# Patient Record
Sex: Male | Born: 1975 | Race: White | Hispanic: No | Marital: Married | Smoking: Never smoker
Health system: Southern US, Community
[De-identification: ages and names within clinical notes are randomized; demographics above are authoritative.]

## PROBLEM LIST (undated history)

## (undated) DIAGNOSIS — S34102A Unspecified injury to L2 level of lumbar spinal cord, initial encounter: Secondary | ICD-10-CM

## (undated) DIAGNOSIS — I959 Hypotension, unspecified: Secondary | ICD-10-CM

## (undated) HISTORY — PX: OTHER SURGICAL HISTORY: SHX169

---

## 2006-05-02 DIAGNOSIS — S34102A Unspecified injury to L2 level of lumbar spinal cord, initial encounter: Secondary | ICD-10-CM

## 2006-05-02 HISTORY — DX: Unspecified injury to L2 level of lumbar spinal cord, initial encounter: S34.102A

## 2015-10-22 ENCOUNTER — Emergency Department: Payer: 59

## 2015-10-22 ENCOUNTER — Observation Stay
Admission: EM | Admit: 2015-10-22 | Discharge: 2015-10-23 | DRG: 101 | Disposition: A | Discharge status: Home / Self Care | Payer: 59 | Attending: Internal Medicine | Admitting: Internal Medicine

## 2015-10-22 ENCOUNTER — Encounter: Payer: Self-pay | Admitting: Emergency Medicine

## 2015-10-22 ENCOUNTER — Inpatient Hospital Stay: Payer: 59

## 2015-10-22 DIAGNOSIS — R569 Unspecified convulsions: Principal | ICD-10-CM | POA: Diagnosis present

## 2015-10-22 DIAGNOSIS — R42 Dizziness and giddiness: Secondary | ICD-10-CM | POA: Diagnosis not present

## 2015-10-22 DIAGNOSIS — E876 Hypokalemia: Secondary | ICD-10-CM | POA: Diagnosis not present

## 2015-10-22 DIAGNOSIS — G8929 Other chronic pain: Secondary | ICD-10-CM | POA: Diagnosis present

## 2015-10-22 DIAGNOSIS — M549 Dorsalgia, unspecified: Secondary | ICD-10-CM | POA: Diagnosis not present

## 2015-10-22 HISTORY — DX: Hypotension, unspecified: I95.9

## 2015-10-22 HISTORY — DX: Unspecified injury to L2 level of lumbar spinal cord, initial encounter: S34.102A

## 2015-10-22 LAB — HEPATIC FUNCTION PANEL
ALK PHOS: 54 U/L (ref 38–126)
ALT: 13 U/L — ABNORMAL LOW (ref 17–63)
AST: 19 U/L (ref 15–41)
Albumin: 4.3 g/dL (ref 3.5–5.0)
Bilirubin, Direct: <0.1 mg/dL — ABNORMAL LOW (ref 0.1–0.5)
TOTAL PROTEIN: 6.5 g/dL (ref 6.5–8.1)
Total Bilirubin: 0.5 mg/dL (ref 0.3–1.2)

## 2015-10-22 LAB — CBC
HCT: 40.3 % (ref 40.0–52.0)
Hemoglobin: 13.8 g/dL (ref 13.0–18.0)
MCH: 32.4 pg (ref 26.0–34.0)
MCHC: 34.4 g/dL (ref 32.0–36.0)
MCV: 94.3 fL (ref 80.0–100.0)
PLATELETS: 150 10*3/uL (ref 150–440)
RBC: 4.27 MIL/uL — ABNORMAL LOW (ref 4.40–5.90)
RDW: 12.5 % (ref 11.5–14.5)
WBC: 4.4 10*3/uL (ref 3.8–10.6)

## 2015-10-22 LAB — BASIC METABOLIC PANEL
ANION GAP: 9 (ref 5–15)
BUN: 14 mg/dL (ref 6–20)
CALCIUM: 8.9 mg/dL (ref 8.9–10.3)
CO2: 22 mmol/L (ref 22–32)
Chloride: 109 mmol/L (ref 101–111)
Creatinine, Ser: 1.01 mg/dL (ref 0.61–1.24)
GFR calc Af Amer: >60 mL/min (ref >60)
GLUCOSE: 144 mg/dL — AB (ref 65–99)
Potassium: 4.1 mmol/L (ref 3.5–5.1)
SODIUM: 140 mmol/L (ref 135–145)

## 2015-10-22 LAB — URINALYSIS COMPLETE WITH MICROSCOPIC (ARMC ONLY)
Bilirubin Urine: NEGATIVE
GLUCOSE, UA: NEGATIVE mg/dL
Hgb urine dipstick: NEGATIVE
Ketones, ur: NEGATIVE mg/dL
Leukocytes, UA: NEGATIVE
Nitrite: NEGATIVE
Protein, ur: NEGATIVE mg/dL
SQUAMOUS EPITHELIAL / LPF: NONE SEEN
Specific Gravity, Urine: 1.016 (ref 1.005–1.030)
pH: 6 (ref 5.0–8.0)

## 2015-10-22 LAB — URINE DRUG SCREEN, QUALITATIVE (ARMC ONLY)
AMPHETAMINES, UR SCREEN: NOT DETECTED
BARBITURATES, UR SCREEN: NOT DETECTED
Benzodiazepine, Ur Scrn: NOT DETECTED
COCAINE METABOLITE, UR ~~LOC~~: NOT DETECTED
Cannabinoid 50 Ng, Ur ~~LOC~~: NOT DETECTED
MDMA (ECSTASY) UR SCREEN: NOT DETECTED
METHADONE SCREEN, URINE: NOT DETECTED
Opiate, Ur Screen: NOT DETECTED
Phencyclidine (PCP) Ur S: NOT DETECTED
TRICYCLIC, UR SCREEN: NOT DETECTED

## 2015-10-22 LAB — MAGNESIUM: MAGNESIUM: 1.9 mg/dL (ref 1.7–2.4)

## 2015-10-22 LAB — TSH: TSH: 1.642 u[IU]/mL (ref 0.350–4.500)

## 2015-10-22 LAB — PHOSPHORUS: Phosphorus: 3.1 mg/dL (ref 2.5–4.6)

## 2015-10-22 MED ORDER — GADOBENATE DIMEGLUMINE 529 MG/ML IV SOLN
20.0000 mL | Freq: Once | INTRAVENOUS | Status: AC | PRN
  Administered 2015-10-22: 17:00:00 18 mL via INTRAVENOUS

## 2015-10-22 MED ORDER — ACETAMINOPHEN 650 MG RE SUPP: 650.0000 mg | Freq: Four times a day (QID) | RECTAL | Status: DC | PRN

## 2015-10-22 MED ORDER — SODIUM CHLORIDE 0.9 % IV SOLN
1000.0000 mg | Freq: Once | INTRAVENOUS | Status: AC
  Administered 2015-10-22: 1000 mg via INTRAVENOUS
  Filled 2015-10-22: qty 10

## 2015-10-22 MED ORDER — LORAZEPAM 2 MG/ML IJ SOLN
INTRAMUSCULAR | Status: AC
  Administered 2015-10-22: 1 mg via INTRAVENOUS
  Filled 2015-10-22: qty 1

## 2015-10-22 MED ORDER — SODIUM CHLORIDE 0.9 % IV SOLN
1000.0000 mg | Freq: Two times a day (BID) | INTRAVENOUS | Status: DC
  Administered 2015-10-22 – 2015-10-23 (×2): 1000 mg via INTRAVENOUS
  Filled 2015-10-22 (×6): qty 10

## 2015-10-22 MED ORDER — SODIUM CHLORIDE 0.9 % IV SOLN
INTRAVENOUS | Status: DC
  Administered 2015-10-22 – 2015-10-23 (×2): via INTRAVENOUS

## 2015-10-22 MED ORDER — LORAZEPAM 2 MG/ML IJ SOLN
1.0000 mg | Freq: Once | INTRAMUSCULAR | Status: AC
  Administered 2015-10-22: 1 mg via INTRAVENOUS

## 2015-10-22 MED ORDER — ACETAMINOPHEN 325 MG PO TABS: 650.0000 mg | ORAL_TABLET | Freq: Four times a day (QID) | ORAL | Status: DC | PRN

## 2015-10-22 MED ORDER — ENOXAPARIN SODIUM 40 MG/0.4ML ~~LOC~~ SOLN
40.0000 mg | SUBCUTANEOUS | Status: DC
  Administered 2015-10-22: 12:00:00 40 mg via SUBCUTANEOUS
  Filled 2015-10-22: qty 0.4

## 2015-10-22 MED ORDER — LORAZEPAM 2 MG/ML IJ SOLN: 2.0000 mg | INTRAMUSCULAR | Status: DC | PRN

## 2015-10-22 MED ORDER — SODIUM CHLORIDE 0.9% FLUSH
3.0000 mL | Freq: Two times a day (BID) | INTRAVENOUS | Status: DC
  Administered 2015-10-22: 12:00:00 3 mL via INTRAVENOUS

## 2015-10-22 NOTE — Plan of Care (Signed)
Problem: Physical Regulation: Goal: Complications related to the disease process, condition or treatment will be avoided or minimized Outcome: Progressing Pt admitted today from the ED. VSS. No seizure activity noted. Pt is drowsy and confused upon admission and throughout the shift. Denies pain.

## 2015-10-22 NOTE — ED Notes (Signed)
Pt arrived to the ED via [facility] from home for a witnessed seizure (20sec). EMS reports that the Pt's wife stated that the Pt began to seize this am without any HX of it. Pt states that he does not remember anything and that he feel really tired. EMS reports giving 500ml of NS and a BG of 158. Pt is AOx4 in no apparent distress during triage and asked for us to do our best to get him discharged soon because he wants to go to work.

## 2015-10-22 NOTE — ED Provider Notes (Signed)
Patient received in checkout from Dr. Zenda AlpersWebster. Patient noted in the room to be having a tonic-clonic seizure. He was given IV Ativan, placed on oxygen. Have ordered IV Keppra. I will discuss with the hospitalist for admission.  Emily FilbertJonathan E Elayna Tobler, MD 10/22/15 [phone]

## 2015-10-22 NOTE — ED Notes (Signed)
Pt began having seizure like movements, DR Mayford KnifeWilliams at bedside. Ativan ordered.

## 2015-10-22 NOTE — Consult Note (Signed)
Reason for Consult:Seizure Referring Physician: Allena Katz  CC: Seizure  HPI: Samuel Delgado is an 40 y.o. male who by report of wife woke up disoriented at about 0500. His wife reports that she woke up next to him moaning and shaking. She reports that he was appearing to have a seizure. He was not coming to consciousness easily so she became concerned. She reports it took about an hour and a half for him to return to his normal state. There was no tongue biting or bowel/bladder incontinence.  The patient has no history of seizure. The patient reports he has a headache but denies blurred vision. He has been sleeping well he has been eating and drinking well. He said no nausea no vomiting no diarrhea. He reports that before he went to bed he felt fine.  No history of significant head trauma, CNS infection or birth injury.  Past Medical History  Diagnosis Date  . L2 spinal cord injury (HCC) 2008  . Hypotension     Past Surgical History  Procedure Laterality Date  . None      Family history: Both parents alive with hypertension and depression.  Two children alive and well.  No family history of seizures.    Social History:  reports that he has never smoked. He does not have any smokeless tobacco history on file. He reports that he does not drink alcohol or use illicit drugs.  Quit drinking about 18 months ago.    Allergies  Allergen Reactions  . Eggs Or Egg-Derived Products Diarrhea  . Gluten Meal Other (See Comments)    Sensitivity   . Mushroom Extract Complex Diarrhea    Medications:  I have reviewed the patient's current medications. Prior to Admission:  No prescriptions prior to admission   Scheduled: . enoxaparin (LOVENOX) injection  40 mg Subcutaneous Q24H  . levETIRAcetam  1,000 mg Intravenous Q12H  . sodium chloride flush  3 mL Intravenous Q12H    ROS: History obtained from wife  General ROS: negative for - chills, fatigue, fever, night sweats, weight gain or weight  loss Psychological ROS: negative for - behavioral disorder, hallucinations, memory difficulties, mood swings or suicidal ideation Ophthalmic ROS: negative for - blurry vision, double vision, eye pain or loss of vision ENT ROS: dizziness for the past 1-2 months Allergy and Immunology ROS: negative for - hives or itchy/watery eyes Hematological and Lymphatic ROS: negative for - bleeding problems, bruising or swollen lymph nodes Endocrine ROS: negative for - galactorrhea, hair pattern changes, polydipsia/polyuria or temperature intolerance Respiratory ROS: negative for - cough, hemoptysis, shortness of breath or wheezing Cardiovascular ROS: negative for - chest pain, dyspnea on exertion, edema or irregular heartbeat Gastrointestinal ROS: negative for - abdominal pain, diarrhea, hematemesis, nausea/vomiting or stool incontinence Genito-Urinary ROS: negative for - dysuria, hematuria, incontinence or urinary frequency/urgency Musculoskeletal ROS: negative for - joint swelling or muscular weakness Neurological ROS: as noted in HPI Dermatological ROS: negative for rash and skin lesion changes  Physical Examination: Blood pressure 110/70, pulse 83, temperature 97.8 F (36.6 C), temperature source Oral, resp. rate 16, height  (1.88 m), weight 81.647 kg (180 lb), SpO2 100 %.  HEENT-  Normocephalic, no lesions, without obvious abnormality.  Normal external eye and conjunctiva.  Normal TM's bilaterally.  Normal auditory canals and external ears. Normal external nose, mucus membranes and septum.  Normal pharynx. Cardiovascular- S1, S2 normal, pulses palpable throughout   Lungs- chest clear, no wheezing, rales, normal symmetric air entry Abdomen- soft, non-tender; bowel  sounds normal; no masses,  no organomegaly Extremities- no edema Lymph-no adenopathy palpable Musculoskeletal-no joint tenderness, deformity or swelling Skin-warm and dry, no hyperpigmentation, vitiligo, or suspicious  lesions  Neurological Examination Mental Status: Lethargic, oriented, thought content appropriate.  Speech fluent without evidence of aphasia.  Able to follow 3 step commands without difficulty. Cranial Nerves: II: Discs flat bilaterally; Visual fields grossly normal, pupils equal, round, reactive to light and accommodation III,IV, VI: ptosis not present, extra-ocular motions intact bilaterally V,VII: decrease in right NLF, facial light touch sensation normal bilaterally VIII: hearing normal bilaterally IX,X: gag reflex present XI: bilateral shoulder shrug XII: midline tongue extension Motor: Patient lifts all extremities above gravity with no focal weakness noted Sensory: Pinprick and light touch intact throughout, bilaterally Deep Tendon Reflexes: 2+ and symmetric throughout Plantars: Right: downgoing   Left: downgoing Cerebellar: Normal finger-to-nose testing bilaterally Gait: not tested due to safety concerns    Laboratory Studies:   Basic Metabolic Panel:  Recent Labs Lab 10/22/15 0610  NA 140  K 4.1  CL 109  CO2 22  GLUCOSE 144*  BUN 14  CREATININE 1.01  CALCIUM 8.9    Liver Function Tests:  Recent Labs Lab 10/22/15 0610  AST 19  ALT 13*  ALKPHOS 54  BILITOT 0.5  PROT 6.5  ALBUMIN 4.3   No results for input(s): LIPASE, AMYLASE in the last 168 hours. No results for input(s): AMMONIA in the last 168 hours.  CBC:  Recent Labs Lab 10/22/15 0610  WBC 4.4  HGB 13.8  HCT 40.3  MCV 94.3  PLT 150    Cardiac Enzymes: No results for input(s): CKTOTAL, CKMB, CKMBINDEX, TROPONINI in the last 168 hours.  BNP: Invalid input(s): POCBNP  CBG: No results for input(s): GLUCAP in the last 168 hours.  Microbiology: No results found for this or any previous visit.  Coagulation Studies: No results for input(s): LABPROT, INR in the last 72 hours.  Urinalysis:  Recent Labs Lab 10/22/15 0907  COLORURINE YELLOW*  LABSPEC 1.016  PHURINE 6.0  GLUCOSEU  NEGATIVE  HGBUR NEGATIVE  BILIRUBINUR NEGATIVE  KETONESUR NEGATIVE  PROTEINUR NEGATIVE  NITRITE NEGATIVE  LEUKOCYTESUR NEGATIVE    Lipid Panel:  No results found for: CHOL, TRIG, HDL, CHOLHDL, VLDL, LDLCALC  HgbA1C: No results found for: HGBA1C  Urine Drug Screen:     Component Value Date/Time   LABOPIA NONE DETECTED 10/22/2015 0908   COCAINSCRNUR NONE DETECTED 10/22/2015 0908   LABBENZ NONE DETECTED 10/22/2015 0908   AMPHETMU NONE DETECTED 10/22/2015 0908   THCU NONE DETECTED 10/22/2015 0908   LABBARB NONE DETECTED 10/22/2015 0908    Alcohol Level: No results for input(s): ETH in the last 168 hours.   Imaging: Ct Head Wo Contrast  10/22/2015  CLINICAL DATA:  Seizure EXAM: CT HEAD WITHOUT CONTRAST TECHNIQUE: Contiguous axial images were obtained from the base of the skull through the vertex without intravenous contrast. COMPARISON:  None. FINDINGS: Brain: No intracranial hemorrhage, mass effect or midline shift. No acute cortical infarction. No mass lesion is noted on this unenhanced scan. The gray and white-matter differentiation is preserved. No hydrocephalus. No intra or extra-axial fluid collection. Vascular: No vascular calcifications are noted. Skull: Negative for fracture or focal lesion. Sinuses/Orbits: No acute findings. Other: None. IMPRESSION: No acute intracranial abnormality. Electronically Signed   By: Natasha MeadLiviu  Pop M.D.   On: 10/22/2015 08:02     Assessment/Plan: 40 year old male with new onset seizures.  Etiology at this point unclear.  Head CT personally  reviewed and shows no acute changes.  Neurological examination is nonfocal.  Drug screen negative.  Loaded with Keppra and being maintained on 1000mg  BID.  Recommendations: 1. MRI of the brain with and without contrast 2. EEG 3. Serum magnesium and phosphorus 4. Seizure precautions 5. Ativan 1mg  prn seizures.   6. Patient unable to drive, operate heavy machinery, perform activities at heights and participate in  water activities until release by outpatient physician.   Thana FarrLeslie Gor Vestal, MD Neurology [phone] 10/22/2015, 12:29 PM

## 2015-10-22 NOTE — ED Provider Notes (Signed)
[facility] Emergency Department Provider Note   ____________________________________________  Time seen: Approximately 618 AM  I have reviewed the triage vital signs and the nursing notes.   HISTORY  Chief Complaint Seizures    HPI Samuel Delgado is a 40 y.o. male who comes into the hospital today with a seizure. The patient reports he woke up disoriented. His wife reports that she woke up next to him moaning and shaking. She reports that he was appearing to have a seizure. He was not coming to consciousness easily so she became concerned. She reports it took about an hour and a half for him to return to his normal state. The patient had a seizure before. The patient reports he has a headache but denies blurred vision. He has been sleeping well he has been eating and drinking well. He said no nausea no vomiting no diarrhea. He reports that before he went to bed he felt fine. He denies any family. He's had no chest pain no abdominal pain. The patient is here for evaluation.   Past Medical History  Diagnosis Date  . L2 spinal cord injury (HCC) 2008  . Hypotension     There are no active problems to display for this patient.   History reviewed. No pertinent past surgical history.  No current outpatient prescriptions on file.  Allergies Eggs or egg-derived products; Gluten meal; and Mushroom extract complex  History reviewed. No pertinent family history.  Social History Social History  Substance Use Topics  . Smoking status: Never Smoker   . Smokeless tobacco: None  . Alcohol Use: No    Review of Systems Constitutional: No fever/chills Eyes: No visual changes. ENT: No sore throat. Cardiovascular: Denies chest pain. Respiratory: Denies shortness of breath. Gastrointestinal: No abdominal pain.  No nausea, no vomiting.  No diarrhea.  No constipation. Genitourinary: Negative for dysuria. Musculoskeletal: Negative for back pain. Skin: Negative  for rash. Neurological: Headache and seizure  10-point ROS otherwise negative.  ____________________________________________   PHYSICAL EXAM:  VITAL SIGNS: ED Triage Vitals  Enc Vitals Group     BP 10/22/15 0619 114/81 mmHg     Pulse Rate 10/22/15 0619 73     Resp 10/22/15 0619 18     Temp 10/22/15 0619 97.8 F (36.6 C)     Temp Source 10/22/15 0619 Oral     SpO2 10/22/15 0619 100 %     Weight 10/22/15 0619 175 lb (79.379 kg)     Height 10/22/15 0619 6\' 2"  (1.88 m)     Head Cir --      Peak Flow --      Pain Score 10/22/15 0620 7     Pain Loc --      Pain Edu? --      Excl. in GC? --     Constitutional: Alert and oriented. Well appearing and in no acute distress. Eyes: Conjunctivae are normal. PERRL. EOMI. Head: Atraumatic. Nose: No congestion/rhinnorhea. Mouth/Throat: Mucous membranes are moist.  Oropharynx non-erythematous.  Cardiovascular: Normal rate, regular rhythm. Grossly normal heart sounds.  Good peripheral circulation. Respiratory: Normal respiratory effort.  No retractions. Lungs CTAB. Gastrointestinal: Soft and nontender. No distention. Positive bowel sounds Musculoskeletal: No lower extremity tenderness nor edema.   Neurologic:  Normal speech and language. Cranial nerves II through XII grossly intact with no focal motor or neuro deficits. Patient with no ataxia, finger-to-nose and rapid alternating movement intact. Skin:  Skin is warm, dry and intact.  Psychiatric: Mood and affect  are normal.   ____________________________________________   LABS (all labs ordered are listed, but only abnormal results are displayed)  Labs Reviewed  CBC - Abnormal; Notable for the following:    RBC 4.27 (*)    All other components within normal limits  BASIC METABOLIC PANEL - Abnormal; Notable for the following:    Glucose, Bld 144 (*)    All other components within normal limits  URINALYSIS COMPLETEWITH MICROSCOPIC ([facility] ONLY)  URINE DRUG SCREEN, QUALITATIVE ([facility]  ONLY)  CBG MONITORING, ED   ____________________________________________  EKG  None  ____________________________________________  RADIOLOGY  CT head: No acute intracranial abnormality ____________________________________________   PROCEDURES  Procedure(s) performed: None  Critical Care performed: No  ____________________________________________   INITIAL IMPRESSION / ASSESSMENT AND PLAN / ED COURSE  Pertinent labs & imaging results that were available during my care of the patient were reviewed by me and considered in my medical decision making (see chart for details).  This is a 40 year old male who comes into the hospital today with seizure activity. The patient reports that he was feeling well until he had the seizure activity. The patient at this time has returned to baseline he is neurologically intact. We will assess the patient to look for possible causes of the seizure. We will also perform an EKG on the patient.  The patient's care will be signed out to Dr Mayford KnifeWilliams who will further assess the patient and determine his disposition ____________________________________________   FINAL CLINICAL IMPRESSION(S) / ED DIAGNOSES  Final diagnoses:  Seizures (HCC)      NEW MEDICATIONS STARTED DURING THIS VISIT:  New Prescriptions   No medications on file     Note:  This document was prepared using Dragon voice recognition software and may include unintentional dictation errors.    Rebecka ApleyAllison P Santos Sollenberger, MD 10/22/15 [phone]

## 2015-10-22 NOTE — ED Notes (Signed)
Pt s/p seizure, confused, agitated, fighting to get out of bed. MD aware.

## 2015-10-22 NOTE — H&P (Signed)
[facility] [place] [facility]   PATIENT NAME: Samuel ShookBrent Delgado    MR#:  [id number]  DATE OF BIRTH:  07/15/1975  DATE OF ADMISSION:  10/22/2015  PRIMARY CARE PHYSICIAN: No primary care provider on file.   REQUESTING/REFERRING PHYSICIAN:   CHIEF COMPLAINT:   Chief Complaint  Patient presents with  . Seizures    HISTORY OF PRESENT ILLNESS: Samuel ShookBrent Delgado  is a 40 y.o. male with a known history of chronic back pain And chronic dizziness.  Pt was noted to have seizure at home by wife   as tonic clonic type. Patient Became very agitated and confused post seizure. In there er had two more seizures in ED. Pt currently confused and agitated.  Unable to give history.  Wife reports use to drink heavly year ago. None now. She denies any drug abuse. Wife does report that he is under a lot of stress because he is renovating the house   PAST MEDICAL HISTORY:   Past Medical History  Diagnosis Date  . L2 spinal cord injury (HCC) 2008  . Hypotension     PAST SURGICAL HISTORY: Past Surgical History  Procedure Laterality Date  . None      SOCIAL HISTORY:  Social History  Substance Use Topics  . Smoking status: Never Smoker   . Smokeless tobacco: Not on file  . Alcohol Use: No    FAMILY HISTORY: unable to provide   DRUG ALLERGIES:  Allergies  Allergen Reactions  . Eggs Or Egg-Derived Products Diarrhea  . Gluten Meal Other (See Comments)    Sensitivity   . Mushroom Extract Complex Diarrhea    REVIEW OF SYSTEMS:   CONSTITUTIONAL: confused and agiated  MEDICATIONS AT HOME:  Prior to Admission medications   Not on File      PHYSICAL EXAMINATION:   VITAL SIGNS: Blood pressure 114/81, pulse 106, temperature 97.8 F (36.6 C), temperature source Oral, resp. rate 17, height 6\' 2"  (1.88 m), weight 79.379 kg (175 lb), SpO2 98 %.  GENERAL:  40 y.o.-year-old patient lying in the bed agiated EYES: Pupils equal, round, reactive to light and accommodation.  No scleral icterus.   HEENT: Head atraumatic, normocephalic. Oropharynx and nasopharynx clear.  NECK:  Supple, no jugular venous distention. No thyroid enlargement, no tenderness.  LUNGS: Normal breath sounds bilaterally, no wheezing, rales,rhonchi or crepitation. No use of accessory muscles of respiration.  CARDIOVASCULAR: S1, S2 normal. No murmurs, rubs, or gallops.  ABDOMEN: Soft, nontender, nondistended. Bowel sounds present. No organomegaly or mass.  EXTREMITIES: No pedal edema, cyanosis, or clubbing.  NEUROLOGIC:  conused moving all ext PSYCHIATRIC: confused SKIN: No obvious rash, lesion, or ulcer.   LABORATORY PANEL:   CBC  Recent Labs Lab 10/22/15 0610  WBC 4.4  HGB 13.8  HCT 40.3  PLT 150  MCV 94.3  MCH 32.4  MCHC 34.4  RDW 12.5   ------------------------------------------------------------------------------------------------------------------  Chemistries   Recent Labs Lab 10/22/15 0610  NA 140  K 4.1  CL 109  CO2 22  GLUCOSE 144*  BUN 14  CREATININE 1.01  CALCIUM 8.9   ------------------------------------------------------------------------------------------------------------------ estimated creatinine clearance is 111.4 mL/min (by C-G formula based on Cr of 1.01). ------------------------------------------------------------------------------------------------------------------ No results for input(s): TSH, T4TOTAL, T3FREE, THYROIDAB in the last 72 hours.  Invalid input(s): FREET3   Coagulation profile No results for input(s): INR, PROTIME in the last 168 hours. ------------------------------------------------------------------------------------------------------------------- No results for input(s): DDIMER in the last 72 hours. -------------------------------------------------------------------------------------------------------------------  Cardiac Enzymes No results for input(s):  CKMB, TROPONINI, MYOGLOBIN in the last 168 hours.  Invalid  input(s): CK ------------------------------------------------------------------------------------------------------------------ Invalid input(s): POCBNP  ---------------------------------------------------------------------------------------------------------------  Urinalysis No results found for: COLORURINE, APPEARANCEUR, LABSPEC, PHURINE, GLUCOSEU, HGBUR, BILIRUBINUR, KETONESUR, PROTEINUR, UROBILINOGEN, NITRITE, LEUKOCYTESUR   RADIOLOGY: Ct Head Wo Contrast  10/22/2015  CLINICAL DATA:  Seizure EXAM: CT HEAD WITHOUT CONTRAST TECHNIQUE: Contiguous axial images were obtained from the base of the skull through the vertex without intravenous contrast. COMPARISON:  None. FINDINGS: Brain: No intracranial hemorrhage, mass effect or midline shift. No acute cortical infarction. No mass lesion is noted on this unenhanced scan. The gray and white-matter differentiation is preserved. No hydrocephalus. No intra or extra-axial fluid collection. Vascular: No vascular calcifications are noted. Skull: Negative for fracture or focal lesion. Sinuses/Orbits: No acute findings. Other: None. IMPRESSION: No acute intracranial abnormality. Electronically Signed   By: Natasha MeadLiviu  Pop M.D.   On: 10/22/2015 08:02    EKG: Orders placed or performed during the hospital encounter of 10/22/15  . ED EKG  . ED EKG    IMPRESSION AND PLAN: Pt is 40 y.o with chronic back pain with sz  1. Mutlipel SZ I will ask neurology to see We will treat with IV Keppra Use when necessary Ativan for his agitation Obtained drug screen Neuro checks frequently  2. Chronic back pain tyelnol prn  3. Misc: lovenox    All the records are reviewed and case discussed with ED provider. Management plans discussed with the patient, family and they are in agreement.  CODE STATUS:    Code Status Orders        Start     Ordered   10/22/15 0858  Full code   Continuous     10/22/15 0858    Code Status History    Date Active Date  Inactive Code Status Order ID Comments User Context   This patient has a current code status but no historical code status.       TOTAL TIME TAKING CARE OF THIS PATIENT: 55 minutes.    Auburn BilberryPATEL, Randie Tallarico M.D on 10/22/2015 at 9:02 AM  Between 7am to 6pm - Pager - [phone]  After 6pm go to [email or web address] - password [email or web address] [facility]  [facility]  Office  [phone]  CC: Primary care physician; No primary care provider on file.

## 2015-10-23 ENCOUNTER — Inpatient Hospital Stay: Payer: 59

## 2015-10-23 DIAGNOSIS — R569 Unspecified convulsions: Secondary | ICD-10-CM | POA: Insufficient documentation

## 2015-10-23 LAB — CBC
HCT: 37.9 % — ABNORMAL LOW (ref 40.0–52.0)
Hemoglobin: 13.4 g/dL (ref 13.0–18.0)
MCH: 32.7 pg (ref 26.0–34.0)
MCHC: 35.4 g/dL (ref 32.0–36.0)
MCV: 92.5 fL (ref 80.0–100.0)
PLATELETS: 157 10*3/uL (ref 150–440)
RBC: 4.1 MIL/uL — AB (ref 4.40–5.90)
RDW: 12.6 % (ref 11.5–14.5)
WBC: 7.9 10*3/uL (ref 3.8–10.6)

## 2015-10-23 LAB — BASIC METABOLIC PANEL
Anion gap: 6 (ref 5–15)
BUN: 9 mg/dL (ref 6–20)
CALCIUM: 8.5 mg/dL — AB (ref 8.9–10.3)
CO2: 24 mmol/L (ref 22–32)
CREATININE: 0.93 mg/dL (ref 0.61–1.24)
Chloride: 108 mmol/L (ref 101–111)
GFR calc Af Amer: >60 mL/min (ref >60)
Glucose, Bld: 90 mg/dL (ref 65–99)
POTASSIUM: 3.3 mmol/L — AB (ref 3.5–5.1)
SODIUM: 138 mmol/L (ref 135–145)

## 2015-10-23 MED ORDER — LEVETIRACETAM 1000 MG PO TABS: 1000.0000 mg | ORAL_TABLET | Freq: Two times a day (BID) | ORAL | Status: AC

## 2015-10-23 MED ORDER — POTASSIUM CHLORIDE CRYS ER 20 MEQ PO TBCR
40.0000 meq | EXTENDED_RELEASE_TABLET | Freq: Once | ORAL | Status: AC
  Administered 2015-10-23: 40 meq via ORAL
  Filled 2015-10-23: qty 2

## 2015-10-23 NOTE — Progress Notes (Signed)
Discharge instructions provided to patient and parents written and verbal. Information verbalized back. Denies any further needs. All questions answered. D/C with family at side in w/c with volunteer.

## 2015-10-23 NOTE — Care Management (Addendum)
Admitted to [facility] with the diagnosis of seizure activity. Lives with wife. Takes care of all basic and instrumental activities of daily living himself. Drives. No primary care physician listed. No insurance listed. Takes no prescription medications. Never been to [facility]. Works at [employer]. Will get prescriptions filled at [facility].  Will be following up with [facility]. Sherryll BurgerShah, neurology[facility]. Gwenette GreetBrenda S Deadrian Toya RN MSN CCM Care Management [phone]

## 2015-10-23 NOTE — Progress Notes (Signed)
Subjective: No further seizures noted.  Patient awake and alert.    Objective: Current vital signs: BP 118/78 mmHg  Pulse 83  Temp(Src) 97.7 F (36.5 C) (Oral)  Resp 18  Ht 6\' 2"  (1.88 m)  Wt 81.647 kg (180 lb)  BMI 23.10 kg/m2  SpO2 100% Vital signs in last 24 hours: Temp:  [97.7 F (36.5 C)-98.7 F (37.1 C)] 97.7 F (36.5 C) (06/23 1258) Pulse Rate:  [67-83] 83 (06/23 1301) Resp:  [18] 18 (06/23 0602) BP: (84-118)/(37-78) 118/78 mmHg (06/23 1301) SpO2:  [98 %-100 %] 100 % (06/23 1301)  Intake/Output from previous day: 06/22 0701 - 06/23 0700 In: 1403.8 [I.V.:1403.8] Out: 600 [Urine:600] Intake/Output this shift:   Nutritional status: Diet regular Room service appropriate?: Yes; Fluid consistency:: Thin  Neurologic Exam: Mental Status: Alert, oriented, thought content appropriate.  Speech fluent without evidence of aphasia.  Able to follow 3 step commands without difficulty. Cranial Nerves: II: Discs flat bilaterally; Visual fields grossly normal, pupils equal, round, reactive to light and accommodation III,IV, VI: ptosis not present, extra-ocular motions intact bilaterally V,VII: smile symmetric, facial light touch sensation normal bilaterally VIII: hearing normal bilaterally IX,X: gag reflex present XI: bilateral shoulder shrug XII: midline tongue extension Motor: Right : Upper extremity   5/5    Left:     Upper extremity   5/5  Lower extremity   5/5     Lower extremity   5/5 Tone and bulk:normal tone throughout; no atrophy noted Sensory: Pinprick and light touch intact throughout, bilaterally Deep Tendon Reflexes: 2+ and symmetric throughout Plantars: Right: downgoing   Left: downgoing Cerebellar: normal finger-to-nose, normal rapid alternating movements and normal heel-to-shin test Gait: normal gait and station   Lab Results: Basic Metabolic Panel:  Recent Labs Lab 10/22/15 0610 10/22/15 1303 10/23/15 0552  NA 140  --  138  K 4.1  --  3.3*  CL  109  --  108  CO2 22  --  24  GLUCOSE 144*  --  90  BUN 14  --  9  CREATININE 1.01  --  0.93  CALCIUM 8.9  --  8.5*  MG  --  1.9  --   PHOS  --  3.1  --     Liver Function Tests:  Recent Labs Lab 10/22/15 0610  AST 19  ALT 13*  ALKPHOS 54  BILITOT 0.5  PROT 6.5  ALBUMIN 4.3   No results for input(s): LIPASE, AMYLASE in the last 168 hours. No results for input(s): AMMONIA in the last 168 hours.  CBC:  Recent Labs Lab 10/22/15 0610 10/23/15 0552  WBC 4.4 7.9  HGB 13.8 13.4  HCT 40.3 37.9*  MCV 94.3 92.5  PLT 150 157    Cardiac Enzymes: No results for input(s): CKTOTAL, CKMB, CKMBINDEX, TROPONINI in the last 168 hours.  Lipid Panel: No results for input(s): CHOL, TRIG, HDL, CHOLHDL, VLDL, LDLCALC in the last 168 hours.  CBG: No results for input(s): GLUCAP in the last 168 hours.  Microbiology: No results found for this or any previous visit.  Coagulation Studies: No results for input(s): LABPROT, INR in the last 72 hours.  Imaging: Ct Head Wo Contrast  10/22/2015  CLINICAL DATA:  Seizure EXAM: CT HEAD WITHOUT CONTRAST TECHNIQUE: Contiguous axial images were obtained from the base of the skull through the vertex without intravenous contrast. COMPARISON:  None. FINDINGS: Brain: No intracranial hemorrhage, mass effect or midline shift. No acute cortical infarction. No mass lesion is noted on  this unenhanced scan. The gray and white-matter differentiation is preserved. No hydrocephalus. No intra or extra-axial fluid collection. Vascular: No vascular calcifications are noted. Skull: Negative for fracture or focal lesion. Sinuses/Orbits: No acute findings. Other: None. IMPRESSION: No acute intracranial abnormality. Electronically Signed   By: Natasha MeadLiviu  Pop M.D.   On: 10/22/2015 08:02   Mr Lodema PilotBrain W Wo Contrast  10/22/2015  CLINICAL DATA:  40 year old male with witnessed seizure at home and 2 more seizures in the emergency department. Subsequently confused and agitated.  Personal history of chronic back pain and dizziness. Initial encounter. EXAM: MRI HEAD WITHOUT AND WITH CONTRAST TECHNIQUE: Multiplanar, multiecho pulse sequences of the brain and surrounding structures were obtained without and with intravenous contrast. CONTRAST:  18 mL MultiHance COMPARISON:  Head CT without contrast 0739 hours today. FINDINGS: No restricted diffusion to suggest acute infarction. No midline shift, mass effect, evidence of mass lesion, ventriculomegaly, extra-axial collection or acute intracranial hemorrhage. Cervicomedullary junction and pituitary are within normal limits. Major intracranial vascular flow voids are preserved, dominant appearing distal right vertebral artery. Wallace CullensGray and white matter signal is within normal limits throughout the brain. No encephalomalacia or chronic cerebral blood products identified. Postcontrast imaging is degraded by some motion. No abnormal enhancement identified. No dural thickening. Visible internal auditory structures appear normal. Mastoids are clear. Small volume of retained secretions in the nasopharynx. Mild ethmoid sinus mucosal thickening and moderate mucosal thickening or retention cyst in the left sphenoid sinus. Negative orbit and scalp soft tissues. Negative visualized cervical spine. Normal bone marrow signal. IMPRESSION: Normal MRI appearance of the brain. Electronically Signed   By: Odessa FlemingH  Hall M.D.   On: 10/22/2015 17:13    Medications:  I have reviewed the patient's current medications. Scheduled: . enoxaparin (LOVENOX) injection  40 mg Subcutaneous Q24H  . levETIRAcetam  1,000 mg Intravenous Q12H  . sodium chloride flush  3 mL Intravenous Q12H    Assessment/Plan: No further seizures noted.  No side effects noted to Keppra.  MRI of the brain personally reviewed and shows no acute changes.  EEG unremarkable.  Magnesium and Phosphorus are normal.  No etiology for seizures found.  Recommendations: 1.  Patient to remain on Keppra 1000mg   BID 2.  Follow up with neurology on an outpatient basis.   3.  Patient unable to drive, operate heavy machinery, perform activities at heights and participate in water activities until release by outpatient physician.    LOS: 1 day   Thana FarrLeslie Morgan Rennert, MD Neurology [phone] 10/23/2015  2:28 PM

## 2015-10-23 NOTE — Discharge Summary (Signed)
Samuel ShookBrent Montellano, 40 y.o., DOB 06/01/1975, MRN [id number]. Admission date: 10/22/2015 Discharge Date 10/23/2015 Primary MD No primary care provider on file. Admitting Physician Auburn BilberryShreyang Sherriann Szuch, MD  Admission Diagnosis  Seizures [facility](HCC) [R56.9]  Discharge Diagnosis   Active Problems:   Seizure (HCC)   Hypokalemia Intermittent dizziness       Hospital Course Samuel Delgado is a 40 y.o. male with a known history of chronic back pain And chronic dizziness. Pt was noted to have seizure at home by wife as tonic clonic type. He was brought to the ED and had 2 more seizures. Patient had to be started on Keppra. He was very agitated post seizure. He was evaluated with toxic drug urine screen which was negative. He had a CT scan of the head which was negative he was admitted for recurrent seizures. Was seen by neurology they recommended continuing Keppra. He had a EEG which was normal. Also had a MRI of the brain that was negative. At this point he will need outpatient neurology follow-up. Currently he is doing well and stable for discharge. He is instructed not to drive for 6 months or operate heavy machinery. l             Consults  neurology  Significant Tests:  See full reports for all details      Ct Head Wo Contrast  10/22/2015  CLINICAL DATA:  Seizure EXAM: CT HEAD WITHOUT CONTRAST TECHNIQUE: Contiguous axial images were obtained from the base of the skull through the vertex without intravenous contrast. COMPARISON:  None. FINDINGS: Brain: No intracranial hemorrhage, mass effect or midline shift. No acute cortical infarction. No mass lesion is noted on this unenhanced scan. The gray and white-matter differentiation is preserved. No hydrocephalus. No intra or extra-axial fluid collection. Vascular: No vascular calcifications are noted. Skull: Negative for fracture or focal lesion. Sinuses/Orbits: No acute findings. Other: None. IMPRESSION: No acute intracranial abnormality. Electronically  Signed   By: Natasha MeadLiviu  Pop M.D.   On: 10/22/2015 08:02   Mr Lodema PilotBrain W Wo Contrast  10/22/2015  CLINICAL DATA:  40 year old male with witnessed seizure at home and 2 more seizures in the emergency department. Subsequently confused and agitated. Personal history of chronic back pain and dizziness. Initial encounter. EXAM: MRI HEAD WITHOUT AND WITH CONTRAST TECHNIQUE: Multiplanar, multiecho pulse sequences of the brain and surrounding structures were obtained without and with intravenous contrast. CONTRAST:  18 mL MultiHance COMPARISON:  Head CT without contrast 0739 hours today. FINDINGS: No restricted diffusion to suggest acute infarction. No midline shift, mass effect, evidence of mass lesion, ventriculomegaly, extra-axial collection or acute intracranial hemorrhage. Cervicomedullary junction and pituitary are within normal limits. Major intracranial vascular flow voids are preserved, dominant appearing distal right vertebral artery. Wallace CullensGray and white matter signal is within normal limits throughout the brain. No encephalomalacia or chronic cerebral blood products identified. Postcontrast imaging is degraded by some motion. No abnormal enhancement identified. No dural thickening. Visible internal auditory structures appear normal. Mastoids are clear. Small volume of retained secretions in the nasopharynx. Mild ethmoid sinus mucosal thickening and moderate mucosal thickening or retention cyst in the left sphenoid sinus. Negative orbit and scalp soft tissues. Negative visualized cervical spine. Normal bone marrow signal. IMPRESSION: Normal MRI appearance of the brain. Electronically Signed   By: Odessa FlemingH  Hall M.D.   On: 10/22/2015 17:13       Today   Subjective:   Samuel ShookBrent Castorena patient feels well once to go home  Objective:   Blood pressure 98/63,  pulse 74, temperature 97.9 F (36.6 C), temperature source Oral, resp. rate 18, height 6\' 2"  (1.88 m), weight 81.647 kg (180 lb), SpO2 100 %.  .  Intake/Output Summary  (Last 24 hours) at 10/23/15 1243 Last data filed at 10/23/15 0636  Gross per 24 hour  Intake 1403.75 ml  Output    600 ml  Net 803.75 ml    Exam VITAL SIGNS: Blood pressure 98/63, pulse 74, temperature 97.9 F (36.6 C), temperature source Oral, resp. rate 18, height 6\' 2"  (1.88 m), weight 81.647 kg (180 lb), SpO2 100 %.  GENERAL:  40 y.o.-year-old patient lying in the bed with no acute distress.  EYES: Pupils equal, round, reactive to light and accommodation. No scleral icterus. Extraocular muscles intact.  HEENT: Head atraumatic, normocephalic. Oropharynx and nasopharynx clear.  NECK:  Supple, no jugular venous distention. No thyroid enlargement, no tenderness.  LUNGS: Normal breath sounds bilaterally, no wheezing, rales,rhonchi or crepitation. No use of accessory muscles of respiration.  CARDIOVASCULAR: S1, S2 normal. No murmurs, rubs, or gallops.  ABDOMEN: Soft, nontender, nondistended. Bowel sounds present. No organomegaly or mass.  EXTREMITIES: No pedal edema, cyanosis, or clubbing.  NEUROLOGIC: Cranial nerves II through XII are intact. Muscle strength 5/5 in all extremities. Sensation intact. Gait not checked.  PSYCHIATRIC: The patient is alert and oriented x 3.  SKIN: No obvious rash, lesion, or ulcer.   Data Review     CBC w Diff: Lab Results  Component Value Date   WBC 7.9 10/23/2015   HGB 13.4 10/23/2015   HCT 37.9* 10/23/2015   PLT 157 10/23/2015   CMP: Lab Results  Component Value Date   NA 138 10/23/2015   K 3.3* 10/23/2015   CL 108 10/23/2015   CO2 24 10/23/2015   BUN 9 10/23/2015   CREATININE 0.93 10/23/2015   PROT 6.5 10/22/2015   ALBUMIN 4.3 10/22/2015   BILITOT 0.5 10/22/2015   ALKPHOS 54 10/22/2015   AST 19 10/22/2015   ALT 13* 10/22/2015  .  Micro Results No results found for this or any previous visit (from the past 240 hour(s)).      Code Status Orders        Start     Ordered   10/22/15 0858  Full code   Continuous     10/22/15  0858    Code Status History    Date Active Date Inactive Code Status Order ID Comments User Context   This patient has a current code status but no historical code status.          Follow-up Information    [facility] weeks.   Specialty:  Neurology   Why:  for new onset sz   Contact information:   [address] [facility] [place] [address] [phone]       Discharge Medications     Medication List    TAKE these medications        levETIRAcetam 1000 MG tablet  Commonly known as:  KEPPRA  Take 1 tablet (1,000 mg total) by mouth 2 (two) times daily.           Total Time in preparing paper work, data evaluation and todays exam - 35 minutes  Auburn BilberryPATEL, Brelynn Wheller M.D on 10/23/2015 at 12:43 PM  [facility]   Office  [phone]

## 2015-10-23 NOTE — Discharge Instructions (Signed)
°  DIET:  Regular diet  DISCHARGE CONDITION:  Stable  ACTIVITY:  Activity as tolerated  OXYGEN:  Home Oxygen: No.   Oxygen Delivery: room air  DISCHARGE LOCATION:  home    ADDITIONAL DISCHARGE INSTRUCTION: no driving for 6months,   If you experience worsening of your admission symptoms, develop shortness of breath, life threatening emergency, suicidal or homicidal thoughts you must seek medical attention immediately by calling 911 or calling your MD immediately  if symptoms less severe.  You Must read complete instructions/literature along with all the possible adverse reactions/side effects for all the Medicines you take and that have been prescribed to you. Take any new Medicines after you have completely understood and accpet all the possible adverse reactions/side effects.   Please note  You were cared for by a hospitalist during your hospital stay. If you have any questions about your discharge medications or the care you received while you were in the hospital after you are discharged, you can call the unit and asked to speak with the hospitalist on call if the hospitalist that took care of you is not available. Once you are discharged, your primary care physician will handle any further medical issues. Please note that NO REFILLS for any discharge medications will be authorized once you are discharged, as it is imperative that you return to your primary care physician (or establish a relationship with a primary care physician if you do not have one) for your aftercare needs so that they can reassess your need for medications and monitor your lab values.

## 2015-10-23 NOTE — Progress Notes (Signed)
[facility] [place] [facility]        Samuel Danella SensingGienger was admitted to the Hospital on 10/22/2015 and Discharged  10/23/2015 and should be excused from work/school   for4  days starting 10/22/2015 , may return to work/school without any restrictions.  Call Samuel BilberryShreyang Masiah Woody MD with questions.  Samuel BilberryPATEL, Samuel Piercefield M.D on 10/23/2015,at 11:10 AM  [facility] [place] [facility]    Office  [phone]

## 2017-05-26 IMAGING — MR MR HEAD WO/W CM
10 of 13 series · 32 of 48 positions shown · IV contrast (multihance)
Comparison: Head CT without contrast 9975 hours today.

CLINICAL DATA: 39-year-old male with witnessed seizure at home and
2 more seizures in the emergency department. Subsequently confused
and agitated. Personal history of chronic back pain and dizziness.
Initial encounter.

EXAM:
MRI HEAD WITHOUT AND WITH CONTRAST
TECHNIQUE: Multiplanar, multiecho pulse sequences of the brain and surrounding
structures were obtained without and with intravenous contrast.
CONTRAST:  18 mL MultiHance

[Series 4: DWI · axial · 3.0mm · 1.80mm/px · z∈[-88,+67]mm · 4 of 39 slices shown (1 of 2)]
[im 1/39]
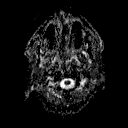
[im 13/39]
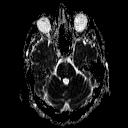
[im 26/39]
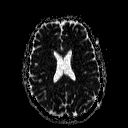
[im 39/39]
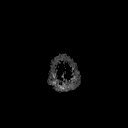

[Series 5: T2 · axial · 5.0mm · 0.60mm/px · z∈[-94,+71]mm · 3 of 27 slices shown (1 of 5)]
[im 1/27]
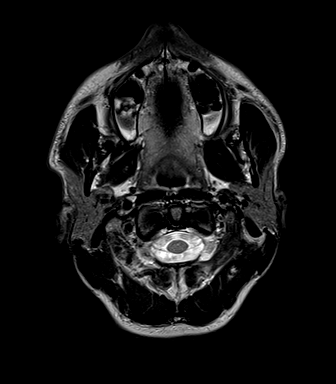
[im 14/27]
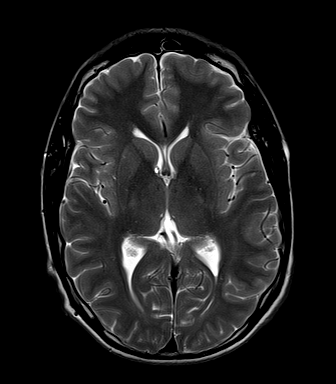
[im 27/27]
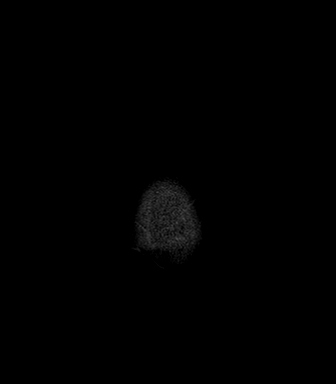

[Series 7: T2 · axial · 5.0mm · 0.45mm/px · z∈[-94,+71]mm · 3 of 27 slices shown (2 of 5)]
[im 1/27]
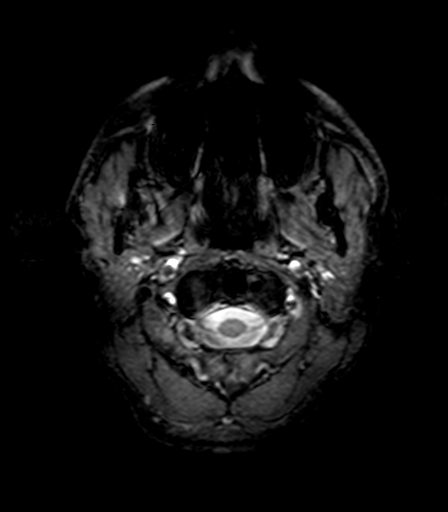
[im 14/27]
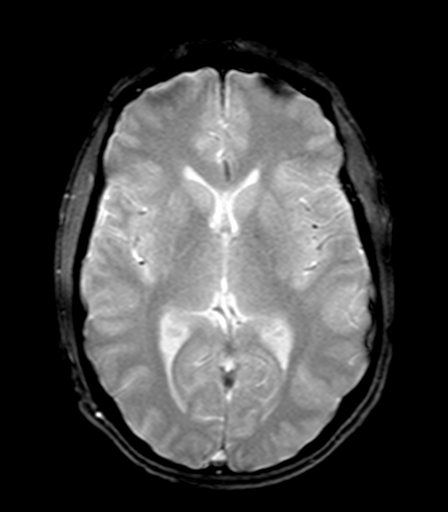
[im 27/27]
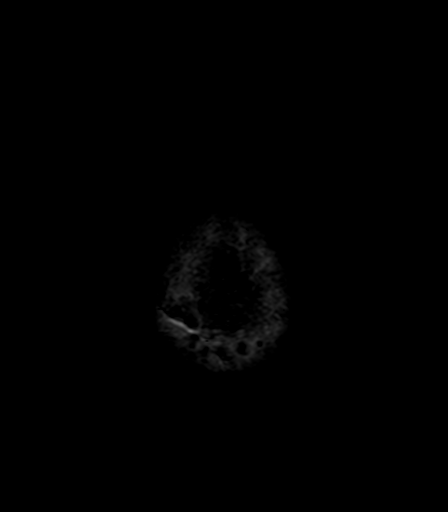

[Series 9: FLAIR · axial · 5.0mm · 0.45mm/px · z∈[-93,+71]mm · 3 of 27 slices shown]
[im 1/27]
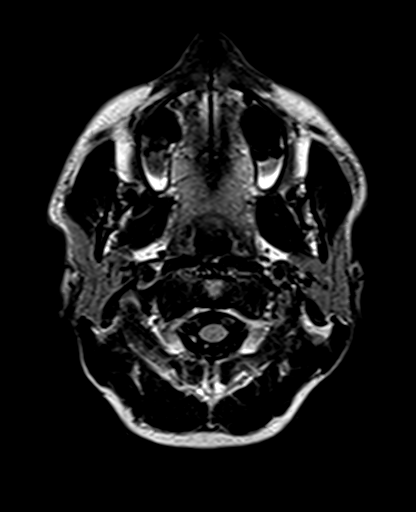
[im 14/27]
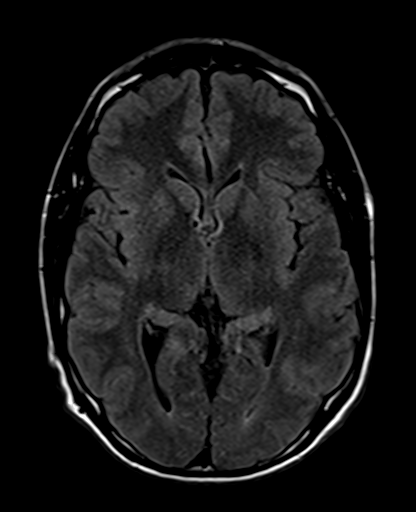
[im 27/27]
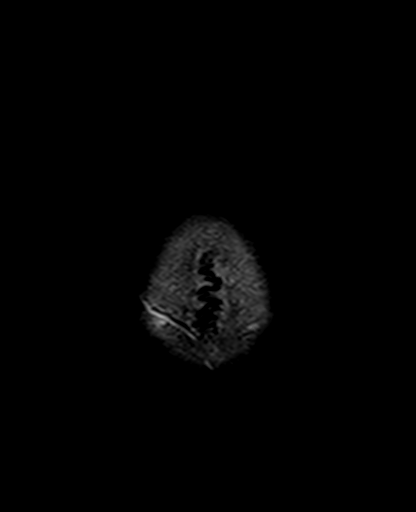

[Series 10: T2 · coronal · 3.0mm · 0.47mm/px · 3 of 31 slices shown (3 of 5)]
[im 1/31]
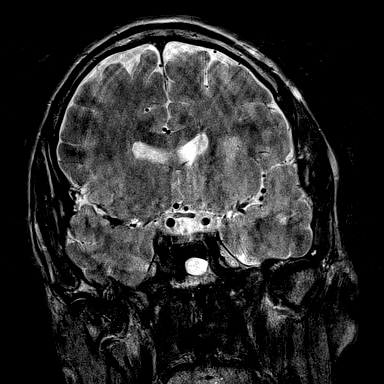
[im 16/31]
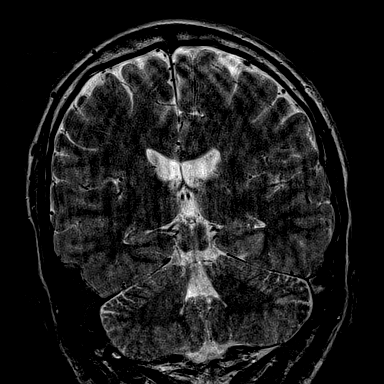
[im 31/31]
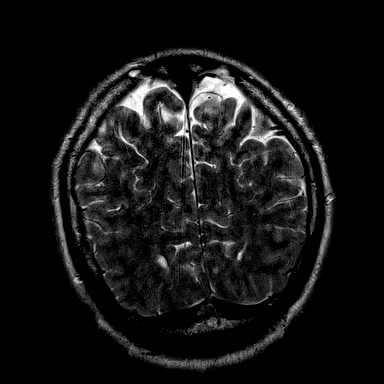

[Series 11: T2 · coronal · 5.0mm · 0.49mm/px · 3 of 27 slices shown (4 of 5)]
[im 1/27]
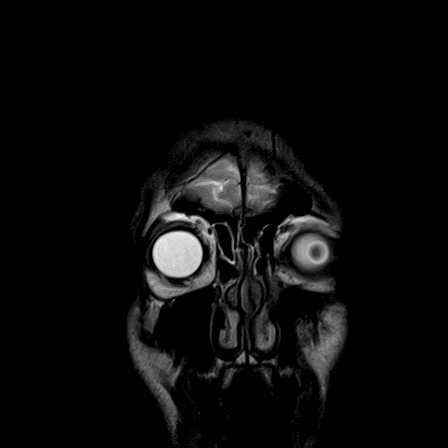
[im 14/27]
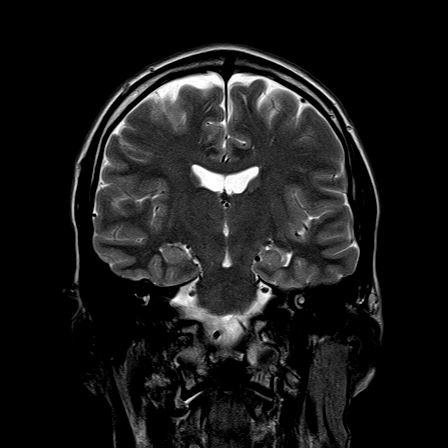
[im 27/27]
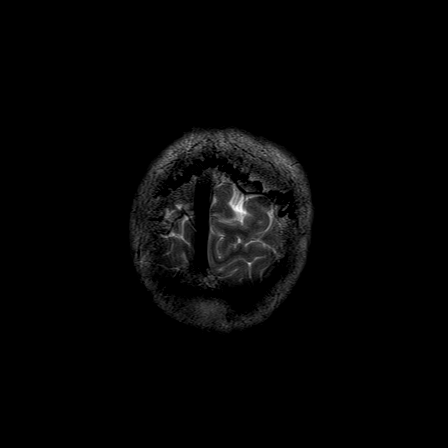

[Series 13: GRE · axial · 5.0mm · 0.45mm/px · z∈[-94,+70]mm · 3 of 27 slices shown]
[im 1/27]
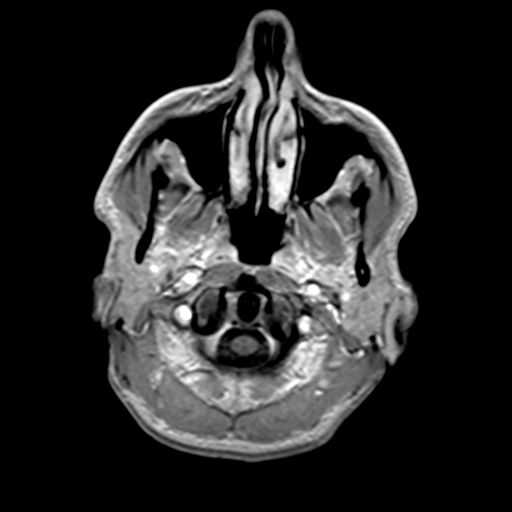
[im 14/27]
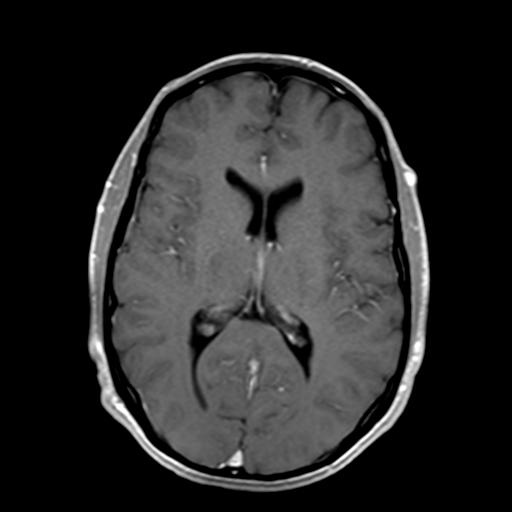
[im 27/27]
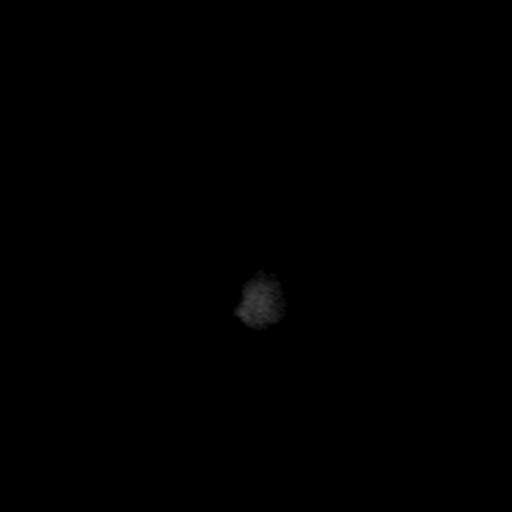

[Series 14: T1 post-contrast · coronal · 5.0mm · 0.43mm/px · 3 of 27 slices shown]
[im 1/27]
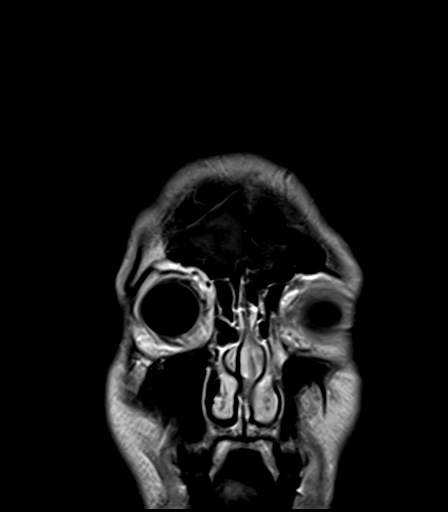
[im 14/27]
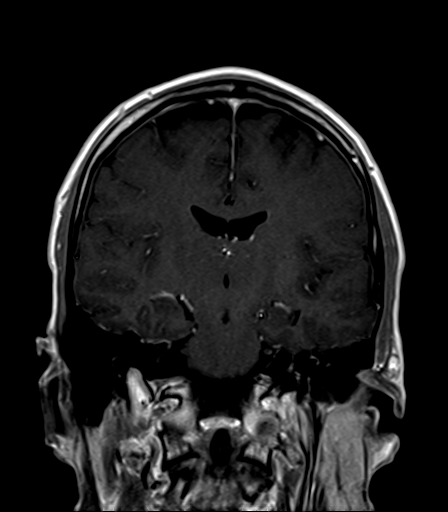
[im 27/27]
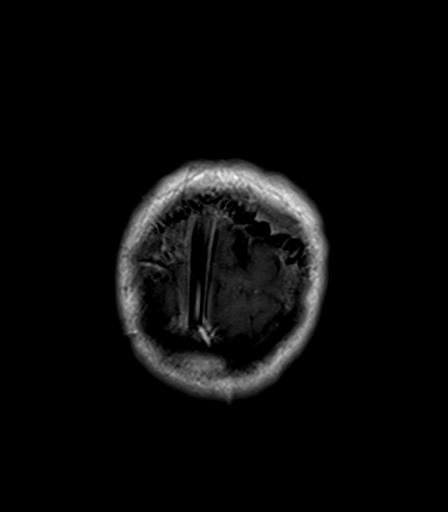

[Series 15: T2 · coronal · 3.0mm · 0.47mm/px · 3 of 31 slices shown (5 of 5)]
[im 1/31]
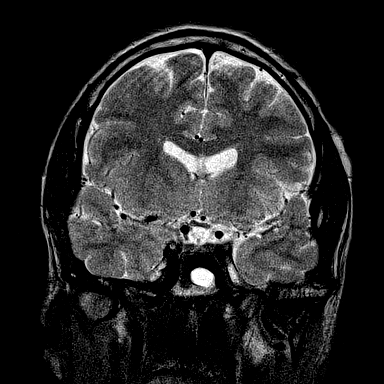
[im 16/31]
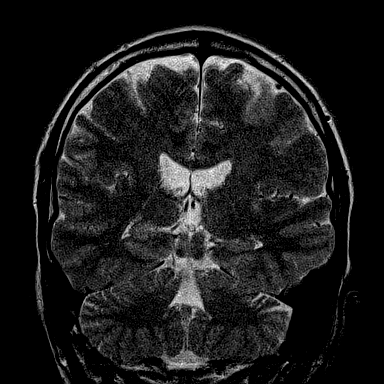
[im 31/31]
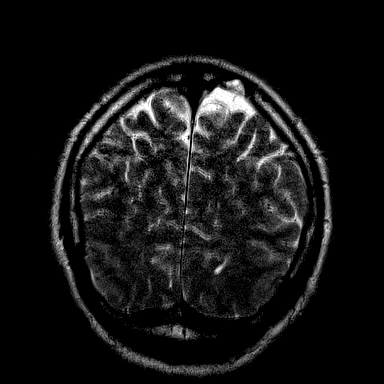

[Series 100: DWI · axial · 3.0mm · 1.80mm/px · z∈[-88,+67]mm · 4 of 42 slices shown (2 of 2)]
[im 1/42]
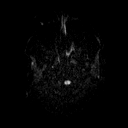
[im 14/42]
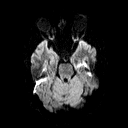
[im 28/42]
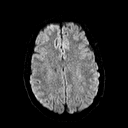
[im 42/42]
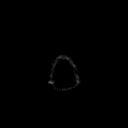

[32 of 48 positions shown; findings below may reference images not displayed]

FINDINGS: No restricted diffusion to suggest acute infarction. No midline
shift, mass effect, evidence of mass lesion, ventriculomegaly,
extra-axial collection or acute intracranial hemorrhage.
Cervicomedullary junction and pituitary are within normal limits.
Major intracranial vascular flow voids are preserved, dominant
appearing distal right vertebral artery.

Gray and white matter signal is within normal limits throughout the
brain. No encephalomalacia or chronic cerebral blood products
identified. Postcontrast imaging is degraded by some motion. No
abnormal enhancement identified. No dural thickening.

Visible internal auditory structures appear normal. Mastoids are
clear. Small volume of retained secretions in the nasopharynx. Mild
ethmoid sinus mucosal thickening and moderate mucosal thickening or
retention cyst in the left sphenoid sinus. Negative orbit and scalp
soft tissues. Negative visualized cervical spine. Normal bone marrow
signal.
IMPRESSION: Normal MRI appearance of the brain.
